# Patient Record
Sex: Male | Born: 1955 | Race: White | Hispanic: No | Marital: Single | Smoking: Current every day smoker
Health system: Southern US, Community
[De-identification: ages and names within clinical notes are randomized; demographics above are authoritative.]

## PROBLEM LIST (undated history)

## (undated) DIAGNOSIS — R569 Unspecified convulsions: Secondary | ICD-10-CM

## (undated) DIAGNOSIS — I10 Essential (primary) hypertension: Secondary | ICD-10-CM

## (undated) DIAGNOSIS — G629 Polyneuropathy, unspecified: Secondary | ICD-10-CM

## (undated) DIAGNOSIS — J449 Chronic obstructive pulmonary disease, unspecified: Secondary | ICD-10-CM

## (undated) DIAGNOSIS — J309 Allergic rhinitis, unspecified: Secondary | ICD-10-CM

## (undated) HISTORY — DX: Chronic obstructive pulmonary disease, unspecified: J44.9

## (undated) HISTORY — DX: Allergic rhinitis, unspecified: J30.9

---

## 1987-09-01 DIAGNOSIS — R569 Unspecified convulsions: Secondary | ICD-10-CM

## 1987-09-01 HISTORY — DX: Unspecified convulsions: R56.9

## 2011-12-09 ENCOUNTER — Emergency Department (HOSPITAL_COMMUNITY): Payer: Non-veteran care

## 2011-12-09 ENCOUNTER — Emergency Department (HOSPITAL_COMMUNITY)
Admission: EM | Admit: 2011-12-09 | Discharge: 2011-12-09 | Disposition: A | Discharge status: Home / Self Care | Payer: Non-veteran care | Attending: Emergency Medicine | Admitting: Emergency Medicine

## 2011-12-09 ENCOUNTER — Encounter (HOSPITAL_COMMUNITY): Payer: Self-pay | Admitting: Emergency Medicine

## 2011-12-09 DIAGNOSIS — S0100XA Unspecified open wound of scalp, initial encounter: Secondary | ICD-10-CM | POA: Insufficient documentation

## 2011-12-09 DIAGNOSIS — F17200 Nicotine dependence, unspecified, uncomplicated: Secondary | ICD-10-CM | POA: Insufficient documentation

## 2011-12-09 DIAGNOSIS — W010XXA Fall on same level from slipping, tripping and stumbling without subsequent striking against object, initial encounter: Secondary | ICD-10-CM | POA: Insufficient documentation

## 2011-12-09 DIAGNOSIS — S0181XA Laceration without foreign body of other part of head, initial encounter: Secondary | ICD-10-CM

## 2011-12-09 DIAGNOSIS — S0180XA Unspecified open wound of other part of head, initial encounter: Secondary | ICD-10-CM | POA: Insufficient documentation

## 2011-12-09 DIAGNOSIS — M542 Cervicalgia: Secondary | ICD-10-CM | POA: Insufficient documentation

## 2011-12-09 DIAGNOSIS — S0101XA Laceration without foreign body of scalp, initial encounter: Secondary | ICD-10-CM

## 2011-12-09 DIAGNOSIS — W1800XA Striking against unspecified object with subsequent fall, initial encounter: Secondary | ICD-10-CM

## 2011-12-09 DIAGNOSIS — R51 Headache: Secondary | ICD-10-CM | POA: Insufficient documentation

## 2011-12-09 DIAGNOSIS — I10 Essential (primary) hypertension: Secondary | ICD-10-CM | POA: Insufficient documentation

## 2011-12-09 DIAGNOSIS — Y92009 Unspecified place in unspecified non-institutional (private) residence as the place of occurrence of the external cause: Secondary | ICD-10-CM | POA: Insufficient documentation

## 2011-12-09 DIAGNOSIS — T148XXA Other injury of unspecified body region, initial encounter: Secondary | ICD-10-CM | POA: Insufficient documentation

## 2011-12-09 HISTORY — DX: Polyneuropathy, unspecified: G62.9

## 2011-12-09 HISTORY — DX: Essential (primary) hypertension: I10

## 2011-12-09 HISTORY — DX: Unspecified convulsions: R56.9

## 2011-12-09 MED ORDER — HYDROCODONE-ACETAMINOPHEN 5-325 MG PO TABS
2.0000 | ORAL_TABLET | Freq: Once | ORAL | Status: AC
  Administered 2011-12-09: 2 via ORAL
  Filled 2011-12-09: qty 2

## 2011-12-09 MED ORDER — IBUPROFEN 800 MG PO TABS
800.0000 mg | ORAL_TABLET | Freq: Once | ORAL | Status: AC
  Administered 2011-12-09: 800 mg via ORAL
  Filled 2011-12-09: qty 1

## 2011-12-09 NOTE — ED Notes (Signed)
Up to get a sandwich, in stocking feet, slipped on wood floors, put arm out to catch himself as he fell.  Struck top of head on a metal electrical outlet box, laceration to top of head.  Other minor abrasions.  Noted to have large hematoma in left axilla.  Severe pain with movement of left arm.

## 2011-12-09 NOTE — Discharge Instructions (Signed)
Your CT scans and Apply ice to the underarm bruise for comfort and to help with the swelling. The steri strips applied to your head will peel off by themselves.    Contusion A contusion is a deep bruise. Contusions happen when an injury causes bleeding under the skin. Signs of bruising include pain, puffiness (swelling), and discolored skin. The contusion may turn blue, purple, or yellow. HOME CARE   Put ice on the injured area.   Put ice in a plastic bag.   Place a towel between your skin and the bag.   Leave the ice on for 15 to 20 minutes, 3 to 4 times a day.   Only take medicine as told by your doctor.   Rest the injured area.   If possible, raise (elevate) the injured area to lessen puffiness.  GET HELP RIGHT AWAY IF:   You have more bruising or puffiness.   You have pain that is getting worse.   Your puffiness or pain is not helped by medicine.  MAKE SURE YOU:   Understand these instructions.   Will watch your condition.   Will get help right away if you are not doing well or get worse.  Document Released: 02/03/2008 Document Revised: 08/06/2011 Document Reviewed: 06/22/2011 [facility] Patient Information 2012 [place], [place].Contusion A contusion is a deep bruise. Contusions happen when an injury causes bleeding under the skin. Signs of bruising include pain, puffiness (swelling), and discolored skin. The contusion may turn blue, purple, or yellow. HOME CARE   Put ice on the injured area.   Put ice in a plastic bag.   Place a towel between your skin and the bag.   Leave the ice on for 15 to 20 minutes, 3 to 4 times a day.   Only take medicine as told by your doctor.   Rest the injured area.   If possible, raise (elevate) the injured area to lessen puffiness.  GET HELP RIGHT AWAY IF:   You have more bruising or puffiness.   You have pain that is getting worse.   Your puffiness or pain is not helped by medicine.  MAKE SURE YOU:   Understand these  instructions.   Will watch your condition.   Will get help right away if you are not doing well or get worse.  Document Released: 02/03/2008 Document Revised: 08/06/2011 Document Reviewed: 06/22/2011 [facility] Patient Information 2012 [place], [place].

## 2011-12-09 NOTE — ED Provider Notes (Signed)
History     CSN: [id number]  Arrival date & time 12/09/11  0430   First MD Initiated Contact with Patient 12/09/11 [phone]      Chief Complaint  Patient presents with  . Arm Pain    (Consider location/radiation/quality/duration/timing/severity/associated sxs/prior treatment) HPI Juan Baldwin is a 56 y.o. male who presents to the Emergency Department complaining of fall last night at home resulting in a superficial laceration to the top of his scalp, a small laceration over his left eyebrow, and bruising to the left axilla. He has taken no medicines and has obtained no relief from discomfort. He does not know if there was loss of consciousness.  [facility]  Past Medical History  Diagnosis Date  . Hypertension   . Neuropathy   . 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 No pertinent past surgical history.  No family history on file.  History  Substance Use Topics  . Smoking status: Current Everyday Smoker -- 1.0 packs/day for 25 years  . Smokeless tobacco: Not on file  . Alcohol Use: 1.2 oz/week    2 Glasses of wine per week     per night      Review of Systems ROS: Statement: All systems negative except as marked or noted in the HPI; Constitutional: Negative for fever and chills. ; ; Eyes: Negative for eye pain, redness and discharge. ; ; ENMT: Negative for ear pain, hoarseness, nasal congestion, sinus pressure and sore throat. ; ; Cardiovascular: Negative for chest pain, palpitations, diaphoresis, dyspnea and peripheral edema. ; ; Respiratory: Negative for cough.,. ; ; Gastrointestinal: Negative for nausea, vomiting, diarrhea, abdominal pain, blood in stool, hematemesis, jaundice and rectal bleeding. . ; ; Genitourinary: Negative for dysuria, flank pain and hematuria. ; ; Musculoskeletal: Negative for back pain and neck pain. Negative for swelling and trauma.; ; Skin: Negative for pruritus, rash, abrasions, blisters,  and skin lesion.; ; Neuro: Negative for headache,  lightheadedness and neck stiffness. Negative for weakness, altered level of consciousness , altered mental status, extremity weakness, paresthesias, involuntary movement, seizure and syncope.     Allergies  Penicillins  Home Medications   Current Outpatient Rx  Name Route Sig Dispense Refill  . ALPRAZOLAM 0.5 MG PO TABS Oral Take 0.5 mg by mouth 2 (two) times daily.    Marland Kitchen CARBAMAZEPINE 200 MG PO TABS Oral Take 200 mg by mouth 4 (four) times daily.    Marland Kitchen CETIRIZINE HCL 10 MG PO TABS Oral Take 10 mg by mouth daily.    . GUAIFENESIN 100 MG/5ML PO LIQD Oral Take 200 mg by mouth 2 times daily at 12 noon and 4 pm.    . HYDROCHLOROTHIAZIDE 25 MG PO TABS Oral Take 25 mg by mouth daily.    Marland Kitchen HYDROCODONE-ACETAMINOPHEN 5-500 MG PO TABS Oral Take 1 tablet by mouth every 4 (four) hours as needed.    Marland Kitchen RANITIDINE HCL 300 MG PO TABS Oral Take 300 mg by mouth 2 (two) times daily.      BP 149/85  Pulse 115  Temp(Src) 99.5 F (37.5 C) (Oral)  Resp 20  Ht 6' (1.829 m)  Wt 272 lb (123.378 kg)  BMI 36.89 kg/m2  SpO2 93%  Physical Exam Physical examination:  Nursing notes reviewed; Vital signs and O2 SAT reviewed;  Constitutional: Well developed, Well nourished, Well hydrated, In no acute distress; Head:  Normocephalic, 6 cm laceration to top of head surrounded by bruising. Small amount of blood present.Laceration to  left eyebrow, 1 cm, bleeding controlled.   Eyes: EOMI, PERRL, No scleral icterus; ENMT: Mouth and pharynx normal, Mucous membranes moist; Neck: Supple, Full range of motion, No lymphadenopathy; Cardiovascular: Regular rate and rhythm, No murmur, rub, or gallop; Respiratory: Breath sounds equal bilaterally, No rales, rhonchi, or rub,  Expiratory wheezing present. Normal respiratory effort/excursion; Chest: Nontender, Movement normal; Abdomen: Soft, Nontender, Nondistended, Normal bowel sounds; Genitourinary: No CVA tenderness; Extremities: Pulses normal, No tenderness, No edema, No calf edema or  asymmetry.; Neuro: AA&Ox3, Major CN grossly intact.  No gross focal motor or sensory deficits in extremities.; Skin: Color normal, Warm, Dry, Large bruise to the left axilla, tender to palpation.   ED Course  Procedures (including critical care time)  Ct Head Wo Contrast  12/09/2011  *RADIOLOGY REPORT*  Clinical Data: History of fall with trauma to the head. Head pain.  CT HEAD WITHOUT CONTRAST  Technique:  Contiguous axial images were obtained from the base of the skull through the vertex without contrast.  Comparison: No priors.  Findings: No acute displaced skull fractures are identified.  No signs of acute intracranial hemorrhage, or other acute intracranial abnormality.  Specifically, no focal mass, mass effect, hydrocephalus or abnormal intra or extra-axial fluid collections. Visualized paranasal sinuses and mastoids are generally well pneumatized, with exception of a left mastoid effusion and some mucosal thickening, and dependent air-fluid level within the right maxillary sinus, suggesting sinusitis.  IMPRESSION: 1.  No acute displaced skull fractures or acute intracranial abnormalities. 2.  Small left mastoid effusion. 3.  Findings suggestive of acute sinusitis of the left maxillary sinus, as above.  Original Report Authenticated By: Florencia Reasons, M.D.   Ct Cervical Spine Wo Contrast  12/09/2011  *RADIOLOGY REPORT*  Clinical Data: History of fall complaining of neck pain.  CT CERVICAL SPINE WITHOUT CONTRAST  Technique:  Multidetector CT imaging of the cervical spine was performed. Multiplanar CT image reconstructions were also generated.  Comparison: No priors.  Findings: No acute displaced fractures of the cervical spine. Alignment is anatomic.  Prevertebral soft tissues are normal. There is multilevel degenerative disc disease, most severe at C3- C4, C5-C6 and C6-C7.  There is also multilevel facet arthropathy. Visualized portions of the lung apices are unremarkable. Incidental note is made  of mucosal thickening and an air-fluid level in the left maxillary sinus, and left mastoid effusion.  IMPRESSION: 1.  No evidence of significant acute traumatic injury to the cervical spine. 2.  Multilevel degenerative disc disease and cervical spondylosis, as detailed above. 3.  Left mastoid effusion. 4.  Findings suggestive of acute sinusitis in the left maxillary sinus.  Original Report Authenticated By: Florencia Reasons, M.D.   Dg Shoulder Left  12/09/2011  *RADIOLOGY REPORT*  Clinical Data: Status post fall on outstretched left arm; large left axillary hematoma.  LEFT SHOULDER - 2+ VIEW  Comparison: None.  Findings: There is no evidence of fracture or dislocation.  The left humeral head is seated within the glenoid fossa.  The acromioclavicular joint is unremarkable in appearance.  The known soft tissue hematoma is not well characterized on radiograph.  The visualized portions of the left lung are clear.  IMPRESSION: No evidence of fracture or dislocation.  Original Report Authenticated By: Tonia Ghent, M.D.    MDM  Patient status post a fall in his home last night resulting in lacerations to his scalp, left eyebrow. He also has sustained a large bruise to the left axilla. CT of the head and cervical spine are  negative for any acute process. Left shoulder x-ray is negative. Steri-Strips were applied to the superficial laceration on the scalp.Pt stable in ED with no significant deterioration in condition.The patient appears reasonably screened and/or stabilized for discharge and I doubt any other medical condition or other [facility] requiring further screening, evaluation, or treatment in the ED at this time prior to discharge.  MDM Reviewed: nursing note and vitals Interpretation: x-ray and CT scan           Nicoletta Dress. Colon Branch, MD 12/09/11 1610

## 2011-12-23 ENCOUNTER — Telehealth: Payer: Self-pay | Admitting: *Deleted

## 2011-12-23 NOTE — Telephone Encounter (Signed)
error 

## 2013-07-21 IMAGING — CT CT HEAD W/O CM
4 of 5 series · 15 of 47 positions shown, 16 images · non-contrast
Comparison: No priors.

CLINICAL DATA: History of fall with trauma to the head. Head pain.

CT HEAD WITHOUT CONTRAST
TECHNIQUE: Contiguous axial images were obtained from the base of
the skull through the vertex without contrast.

[Series 2: headseq 4.8 h37s · axial · 0.51mm/px · z∈[+294,+354]mm · 2 of 36 slices shown, 3 images]
[im 12/36  brain]
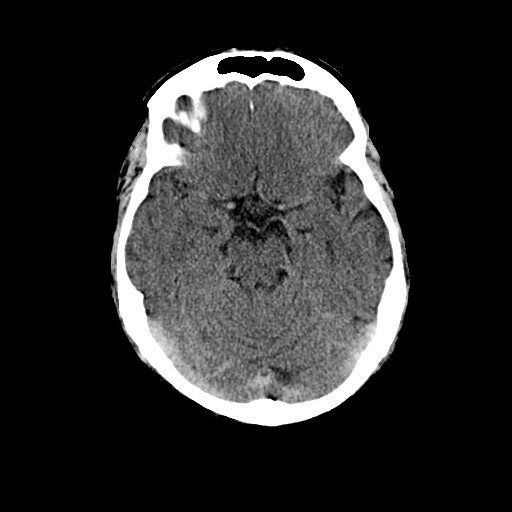
[im 12/36  bone]
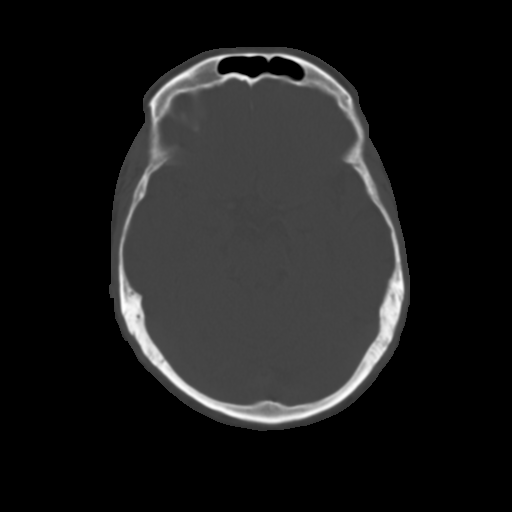
[im 24/36  brain]
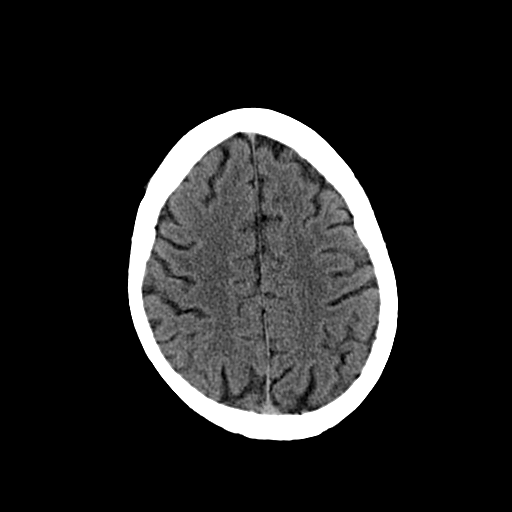

[Series 7: sagittal bone 2.0 · sagittal · 0.29mm/px · 3 of 73 slices shown]
[im 25/73  brain]
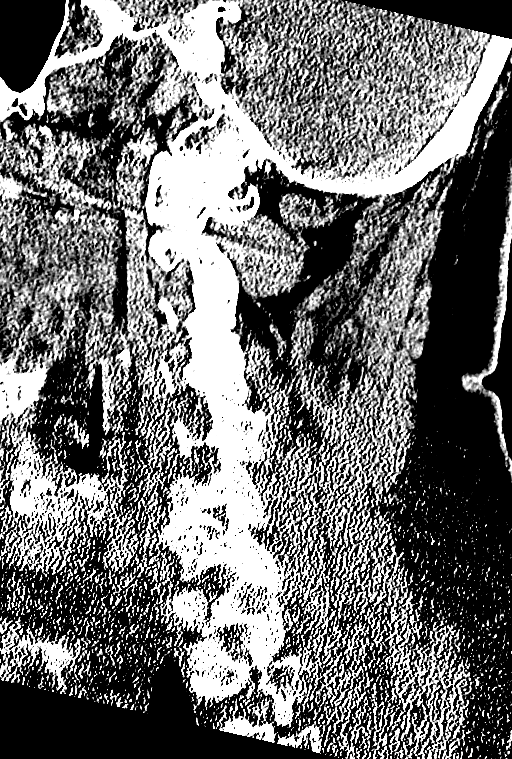
[im 37/73  brain]
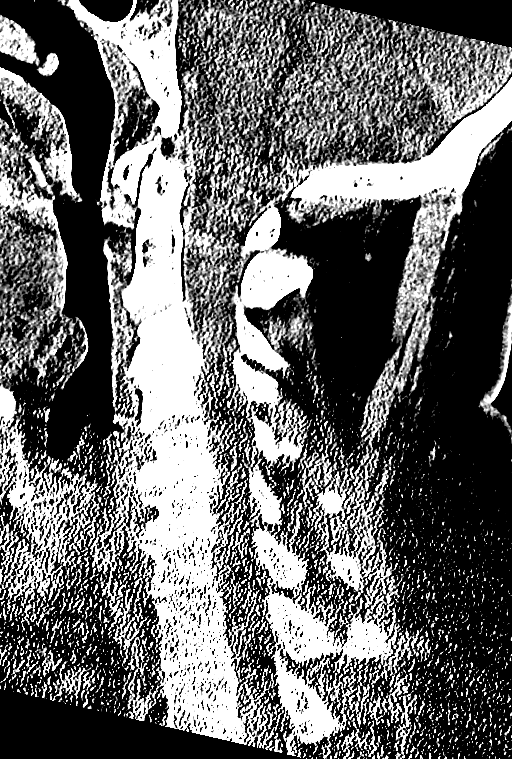
[im 49/73  brain]
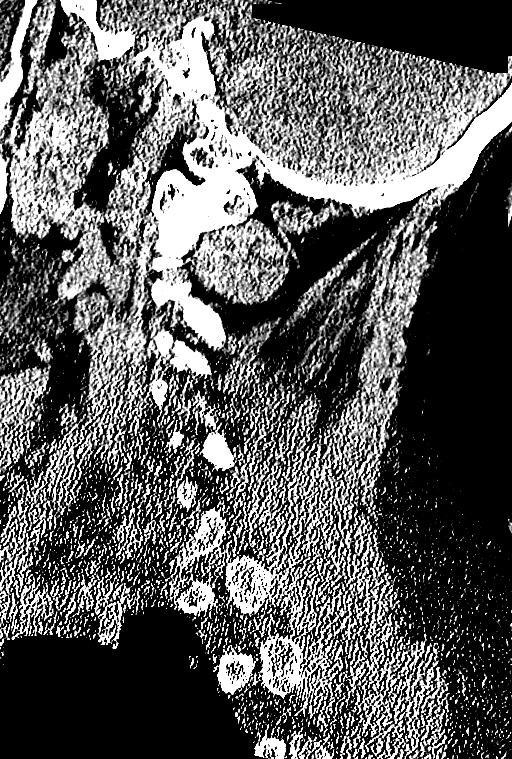

[Series 8: coronal bone 2.0 · coronal · 0.29mm/px · 3 of 55 slices shown]
[im 19/55  brain]
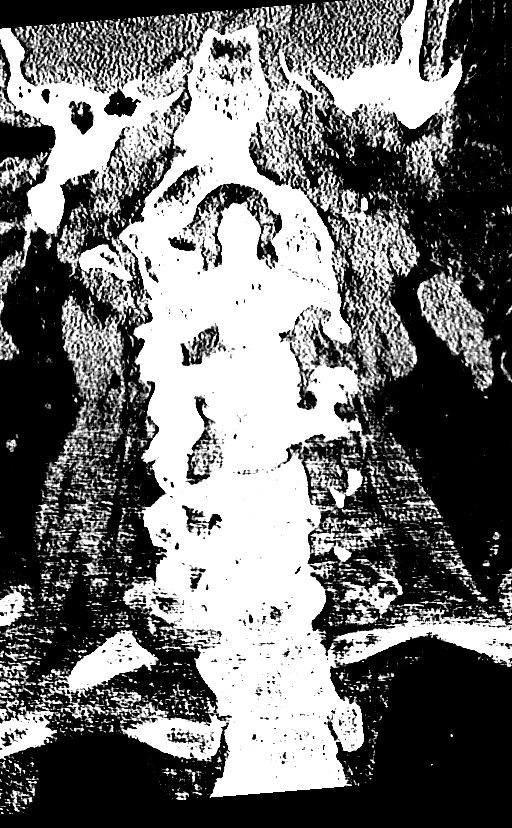
[im 25/55  brain]
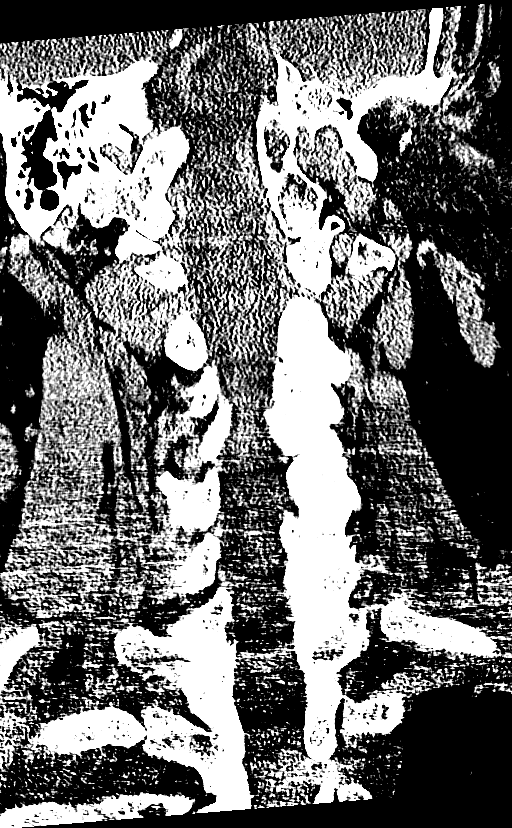
[im 31/55  brain]
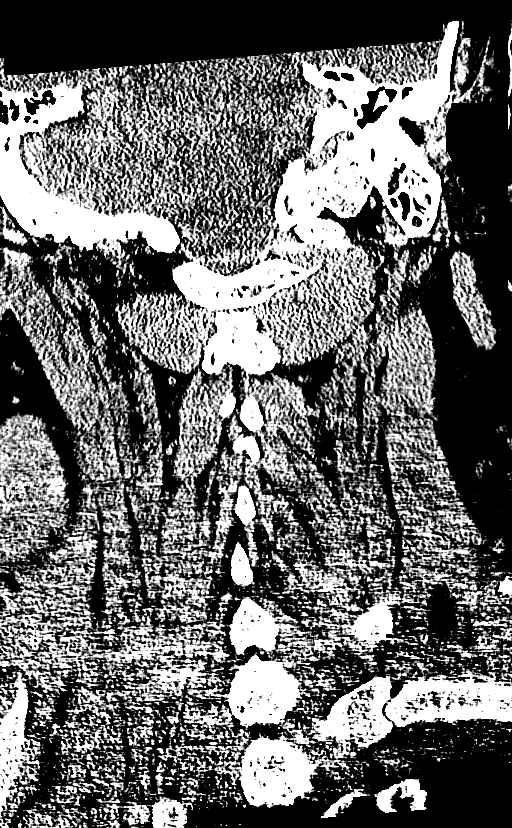

[Series 9: axial bone 2.0 · axial · 0.22mm/px · z∈[+36,+167]mm · 7 of 105 slices shown]
[im 9/105  bone]
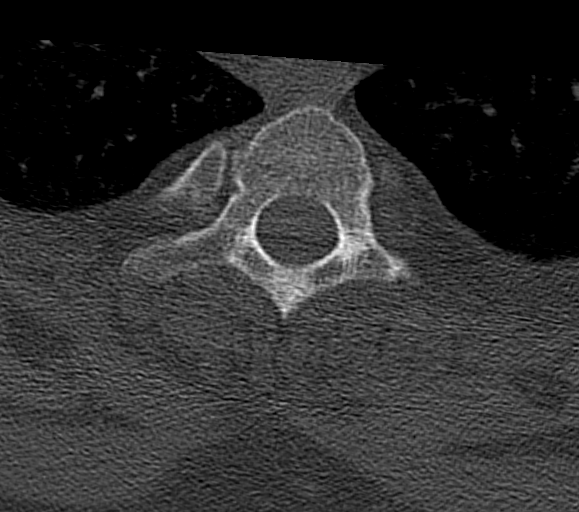
[im 27/105  bone]
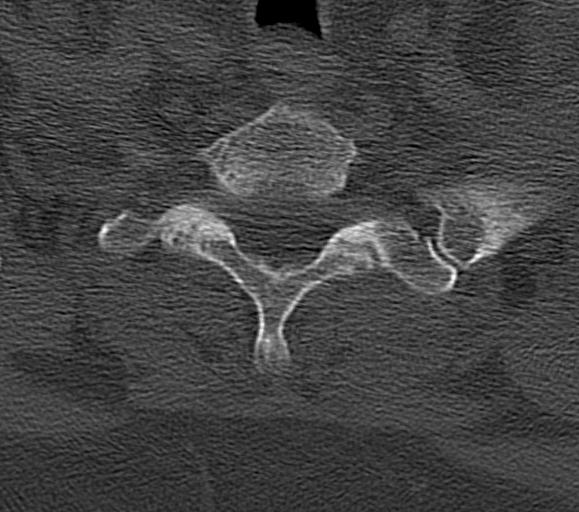
[im 35/105  bone]
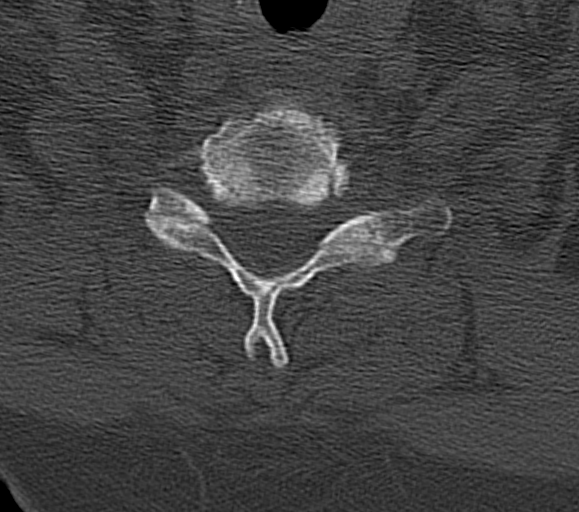
[im 44/105  bone]
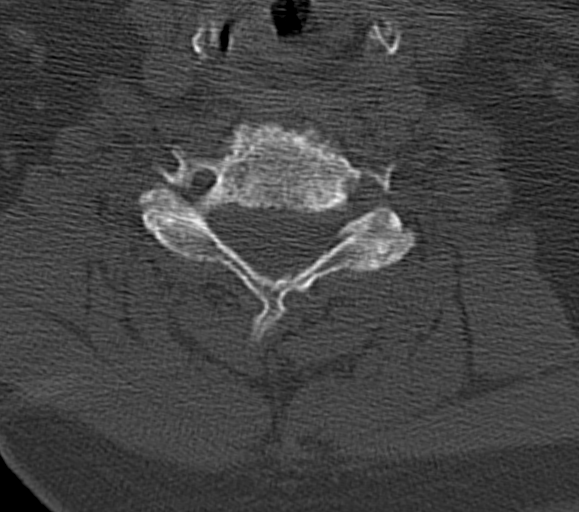
[im 61/105  bone]
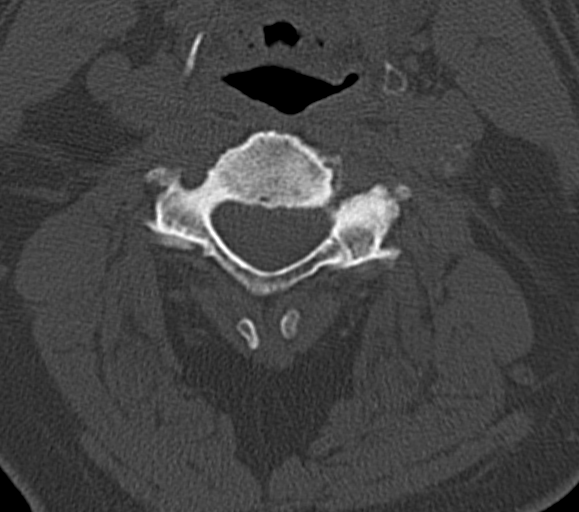
[im 70/105  bone]
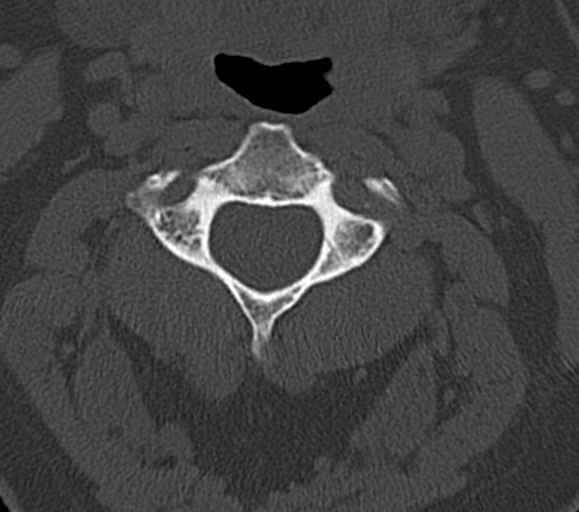
[im 79/105  bone]
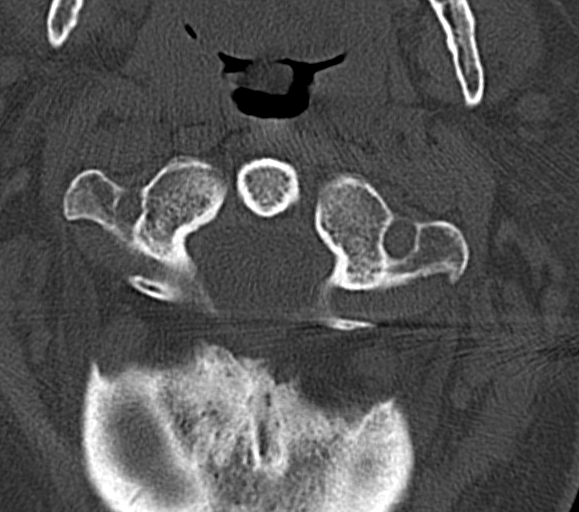

[15 of 47 positions shown; findings below may reference images not displayed]

FINDINGS: No acute displaced skull fractures are identified.  No
signs of acute intracranial hemorrhage, or other acute intracranial
abnormality.  Specifically, no focal mass, mass effect,
hydrocephalus or abnormal intra or extra-axial fluid collections.
Visualized paranasal sinuses and mastoids are generally well
pneumatized, with exception of a left mastoid effusion and some
mucosal thickening, and dependent air-fluid level within the right
maxillary sinus, suggesting sinusitis.
IMPRESSION: 1.  No acute displaced skull fractures or acute intracranial
abnormalities.
2.  Small left mastoid effusion.
3.  Findings suggestive of acute sinusitis of the left maxillary
sinus, as above.

## 2013-07-21 IMAGING — CR DG SHOULDER 2+V*L*
3 series · 3 of 3 positions shown · non-contrast
Comparison: None.

CLINICAL DATA: Status post fall on outstretched left arm; large
left axillary hematoma.

LEFT SHOULDER - 2+ VIEW

[view not recorded (1 of 3)]
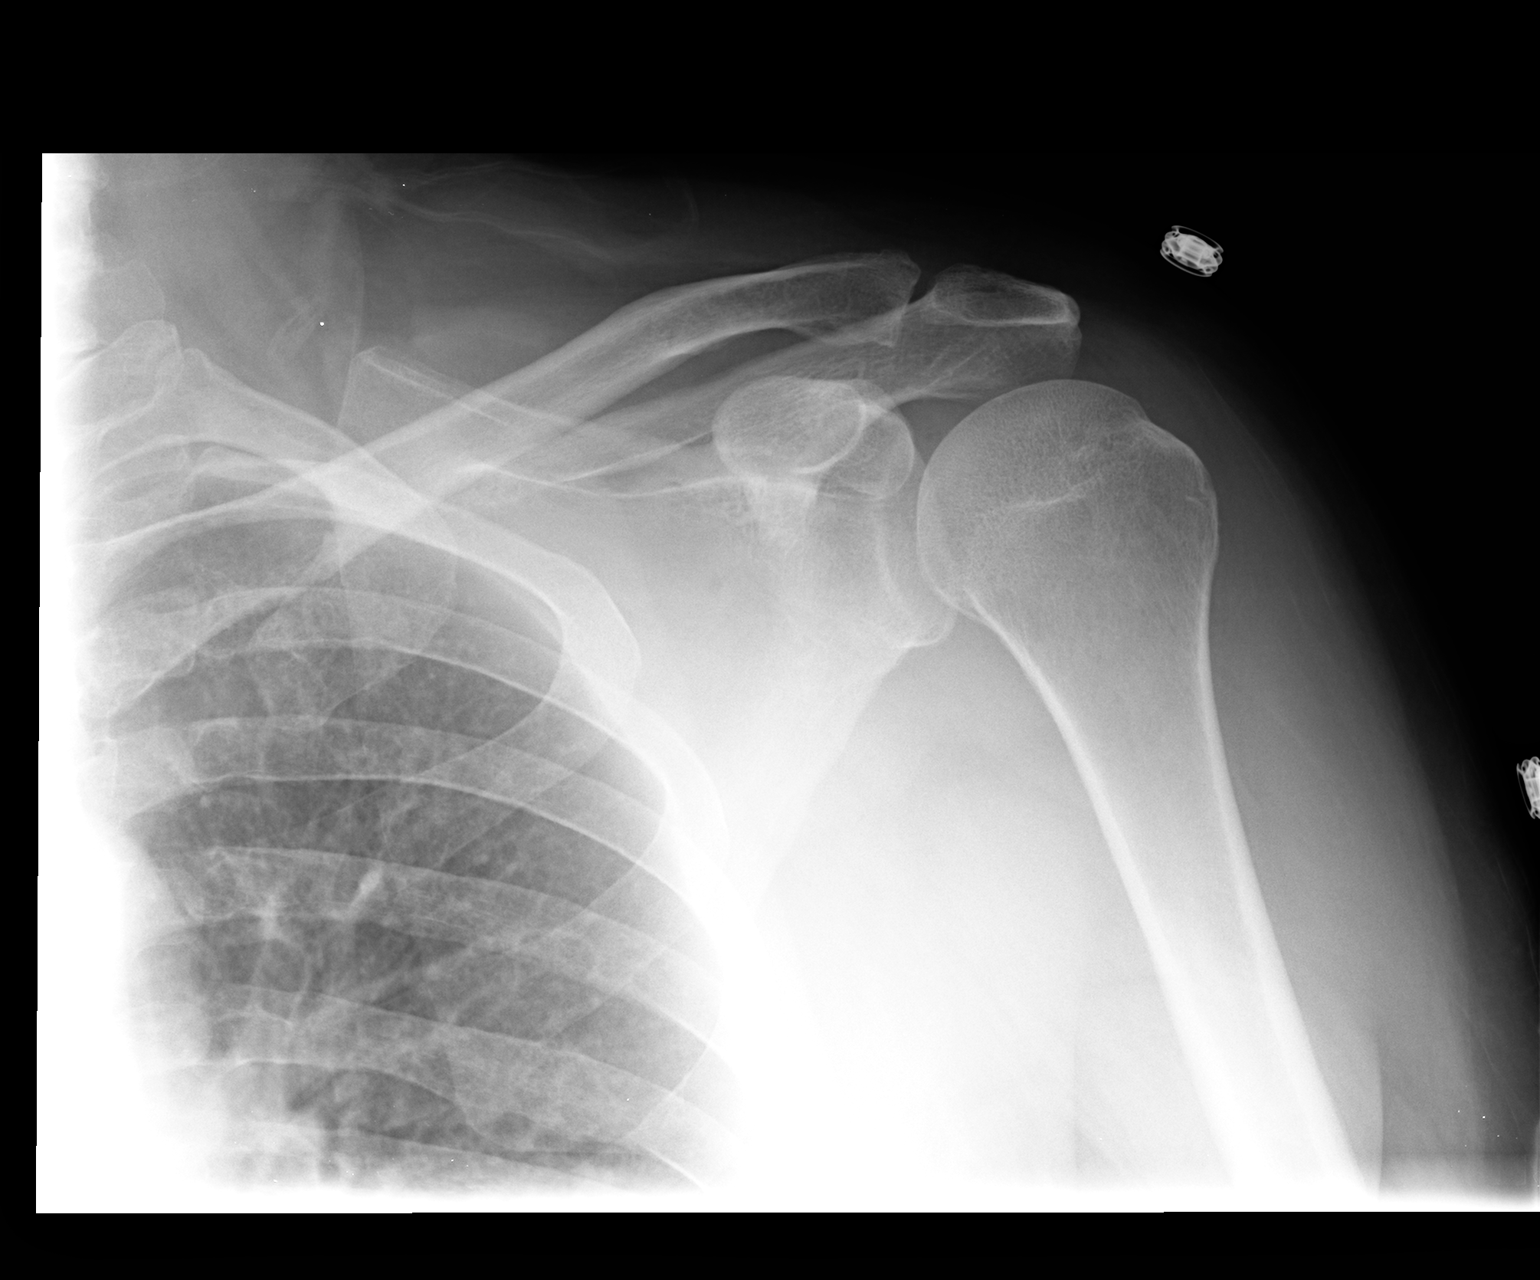

[view not recorded (2 of 3)]
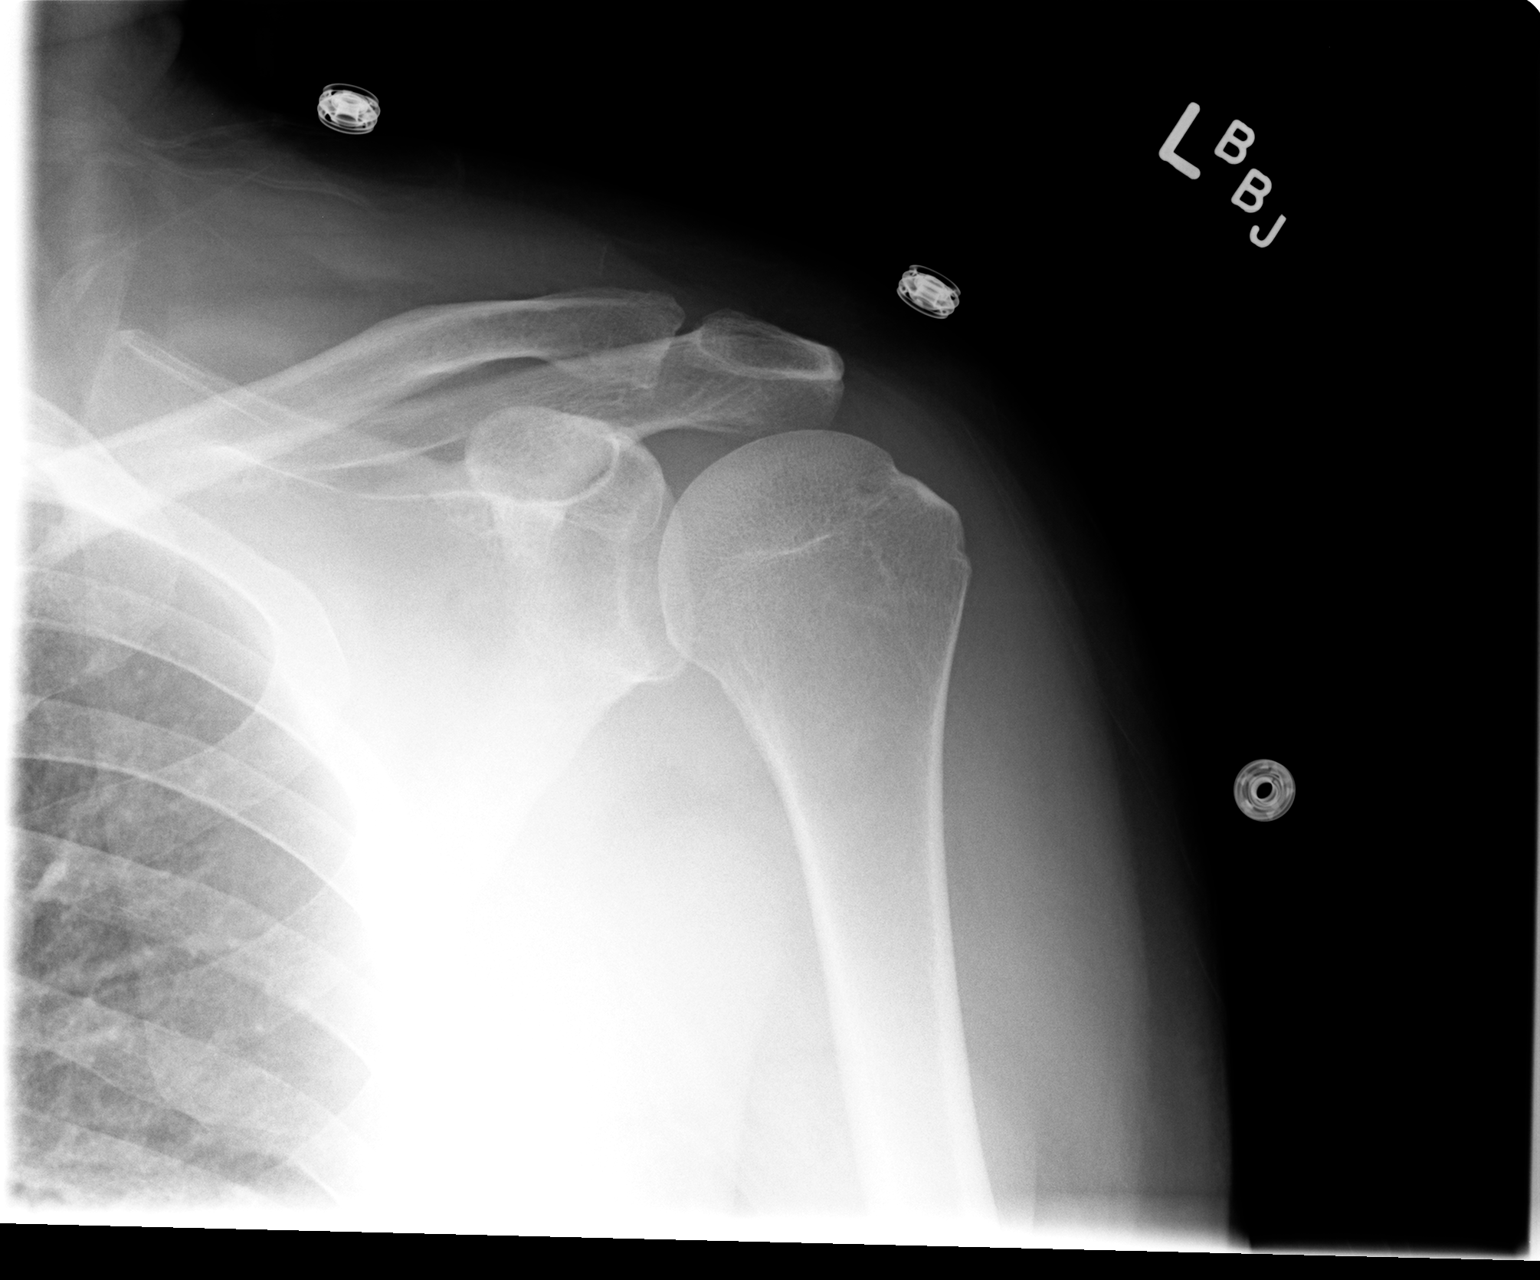

[view not recorded (3 of 3)]
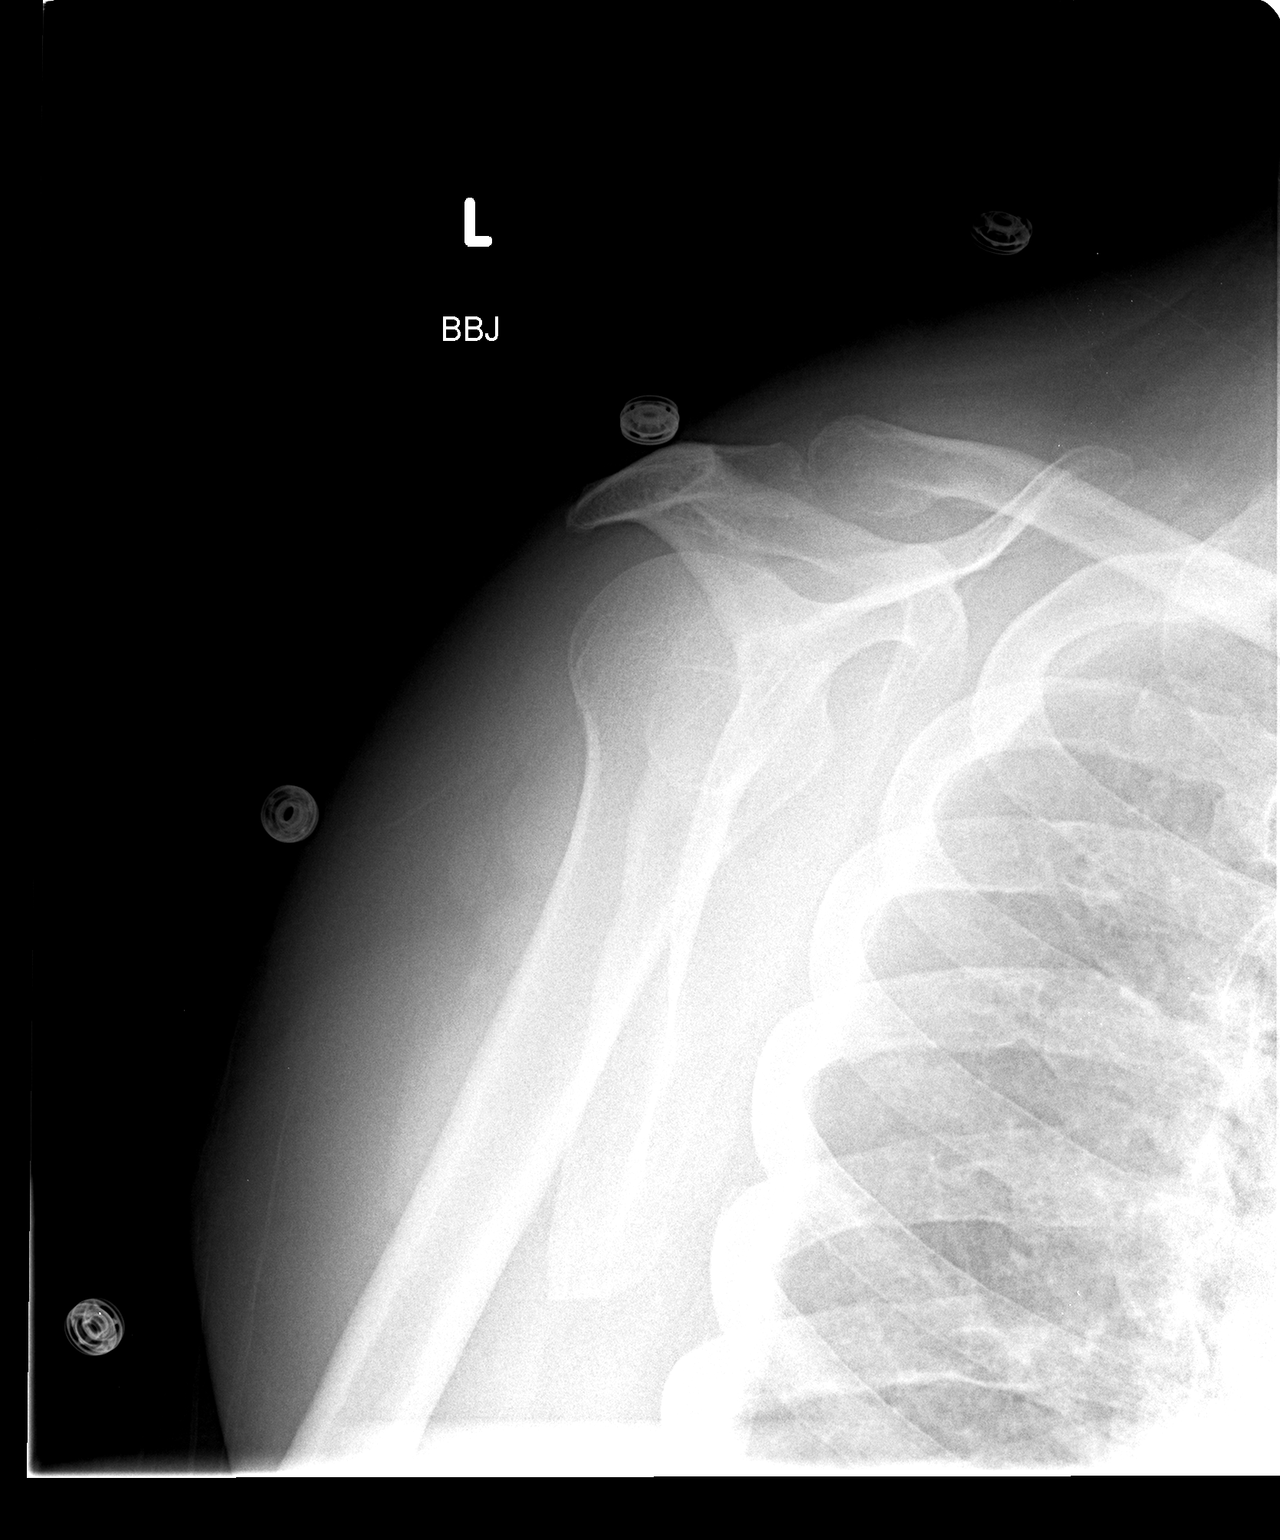

[3 of 3 positions shown; findings below may reference images not displayed]

FINDINGS: There is no evidence of fracture or dislocation.  The
left humeral head is seated within the glenoid fossa.  The
acromioclavicular joint is unremarkable in appearance.  The known
soft tissue hematoma is not well characterized on radiograph.  The
visualized portions of the left lung are clear.
IMPRESSION: No evidence of fracture or dislocation.

## 2015-11-25 ENCOUNTER — Telehealth: Payer: Self-pay | Admitting: *Deleted

## 2015-11-25 NOTE — Telephone Encounter (Signed)
Pt states he has questions about fungal toenail treatment.  I left message explaining 1st the toenails would need to sent for testing for fungus type, then if + the treatment would be discussed with the pt and the options were topical, oral, laser and the doctor would discuss them in more depth.  I encouraged pt to make an appt.

## 2018-07-12 ENCOUNTER — Encounter (INDEPENDENT_AMBULATORY_CARE_PROVIDER_SITE_OTHER): Payer: Self-pay | Admitting: *Deleted

## 2018-09-16 ENCOUNTER — Telehealth: Payer: Self-pay | Admitting: *Deleted

## 2018-09-16 NOTE — Telephone Encounter (Signed)
Patient called scheduled a new patient, patient has clinical questions please call patient

## 2018-09-16 NOTE — Telephone Encounter (Signed)
Called and spoke with patient. Patient was just calling to see how long he would need to be on allergy shots? I informed him SHOULD the doctor decide that this would be in his treatment plan, allergy shots are typically given for 3-5 years. He also states that he sees an allergist within the [place] and they are wanting to do a "Penicilin Challenge" with him and he was just wanting to confirm that this "was a thing." I assured him we do the same challenges.

## 2018-10-05 ENCOUNTER — Other Ambulatory Visit (INDEPENDENT_AMBULATORY_CARE_PROVIDER_SITE_OTHER): Payer: Self-pay | Admitting: *Deleted

## 2018-10-05 DIAGNOSIS — Z860100 Personal history of colon polyps, unspecified: Secondary | ICD-10-CM | POA: Insufficient documentation

## 2018-10-19 ENCOUNTER — Ambulatory Visit (INDEPENDENT_AMBULATORY_CARE_PROVIDER_SITE_OTHER): Payer: PRIVATE HEALTH INSURANCE | Admitting: Allergy

## 2018-10-19 ENCOUNTER — Ambulatory Visit: Payer: PRIVATE HEALTH INSURANCE | Admitting: Allergy and Immunology

## 2018-10-19 ENCOUNTER — Encounter: Payer: Self-pay | Admitting: Allergy

## 2018-10-19 VITALS — BP 112/70 | HR 101 | Temp 97.4°F | Resp 16 | Ht 71.0 in | Wt 287.0 lb

## 2018-10-19 DIAGNOSIS — T50905D Adverse effect of unspecified drugs, medicaments and biological substances, subsequent encounter: Secondary | ICD-10-CM

## 2018-10-19 DIAGNOSIS — H1013 Acute atopic conjunctivitis, bilateral: Secondary | ICD-10-CM | POA: Diagnosis not present

## 2018-10-19 DIAGNOSIS — T781XXD Other adverse food reactions, not elsewhere classified, subsequent encounter: Secondary | ICD-10-CM

## 2018-10-19 DIAGNOSIS — J3089 Other allergic rhinitis: Secondary | ICD-10-CM

## 2018-10-19 DIAGNOSIS — J410 Simple chronic bronchitis: Secondary | ICD-10-CM | POA: Diagnosis not present

## 2018-10-19 MED ORDER — ALBUTEROL SULFATE HFA 108 (90 BASE) MCG/ACT IN AERS: 2.0000 | INHALATION_SPRAY | RESPIRATORY_TRACT | 2 refills | Status: AC | PRN

## 2018-10-19 MED ORDER — OLOPATADINE HCL 0.7 % OP SOLN: 1.0000 [drp] | Freq: Every day | OPHTHALMIC | 3 refills | Status: AC

## 2018-10-19 MED ORDER — LEVOCETIRIZINE DIHYDROCHLORIDE 5 MG PO TABS: 5.0000 mg | ORAL_TABLET | Freq: Every evening | ORAL | 1 refills | Status: AC

## 2018-10-19 NOTE — Patient Instructions (Addendum)
Allergies   - continue avoidance measures for dust mites, dog, grass pollen, tree pollen, weed pollen, cockroach, molds  - trial Xyzal 5mg  daily (this replaces Zyrtec)  - continue Singulair 10mg  daily  - continue Flonase 2 sprays each nostril daily as needed for nasal congestion.  Use for 1-2 weeks at a time before stopping once symptoms improve  - continue nasal Atrovent for nasal drainage as needed  - trial Pazeo or Pataday eye drop 1 drop each eye daily as needed (this replaces Zaditor)  - allergen immunotherapy discussed today including protocol, benefits and risk.  Informational handout provided.    COPD  - will have you have access to albuterol.  have access to albuterol inhaler 2 puffs every 4-6 hours as needed for cough/wheeze/shortness of breath/chest tightness.  May use 15-20 minutes prior to activity.   Monitor frequency of use.    - continue Spiriva 2 puffs daily  - will have you start Symbicort 160mg  2 puffs twice a day over the next month  - will repeat spirometry (breathing testing) to determine if we can improve your lung function with inhaler medications as above  Pollen food allergy syndrome  - The oral allergy syndrome (OAS) or pollen-food allergy syndrome (PFAS) is a relatively common form of food allergy, particularly in adults. It typically occurs in people who have pollen allergies when the immune system "sees" proteins on the food that look like proteins on the pollen. This results in the allergy antibody (IgE) binding to the food instead of the pollen. Patients typically report itching and/or mild swelling of the mouth and throat immediately following ingestion of certain uncooked fruits (including nuts) or raw vegetables. Only a very small number of affected individuals experience systemic allergic reactions, such as anaphylaxis which occurs with true food allergies.    This is likely what's happening with tomatoes.    Drug allergy   - agree with penicillin testing and  challenge with Dr. Blanchard Mane    Follow-up in 1 month

## 2018-10-19 NOTE — Progress Notes (Signed)
New Patient Note  RE: Juan AtlasMichael S Nordlund MRN: [id number] DOB: 1956-04-05 Date of Office Visit: 10/19/2018  Referring provider: Floyde Parkinsyer, Christopher, MD Primary care provider: Floyde Parkinsyer, Christopher, MD  Chief Complaint: Allergies  History of present illness: Juan Baldwin is a 63 y.o. male presenting today for consultation for allergic rhinitis.  He also has history of COPD.   He is seeing Dr. Blanchard ManeKrishnaswamy, allergist, at [facility]     With his allergies he states that he is at a point where his medications are not working.  He is taking Zyrtec daily, Flonase in the evening and nasal Atrovent in the morning, singular daily and Zaditor as needed.  He states since he has been off the Zyrtec that he actually feels better and states that his allergy symptoms have not worsened off Zyrtec for this visit today.  He states that he still has a lot of watery eyes and goopy eye drainage.  He states [facility] however pt states they do not offer allergy shots there.  Thus his referral to me.    He states his allergy symptoms include cough, occasional sneezing fits, nasal congestion/drainage, watery eyes.    Symptoms are worse in the spring and fall but has been year-round lately.    With tomato ingestion he develops mouth itch.  He still eats tomato products.    He states he was told he has COPD about 2 years ago.  He has been on spiriva 2 puffs daily since that time.  He does not have an albuterol inhaler.  He states he gets winded with activities like walking to the mailbox.    He takes pepcid for reflux control but states it does not work as well zantac did.    He has a listed PCN allergy.  Dec 1982 he recalls developing hives after a PCN injection during [employer].  He has been avoiding PCN since that time.  He is set to have PCN testing/challenge on 3/4 with Dr. Blanchard ManeKrishnaswamy.    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    ------------  He had allergy testing via serum IgE in 01/2017 showing in kU/L: d. pter 10.3, d. Far 13.1, dog dander 0.11, [place] grass 0.12, Timothy grass 0.25, Johnson grass 0.17, cockroach 0.23, penicillium 3.7, Cladosporium 0.58, Aspergillus 1.11, Alternaria 0.21, Maple 0.12, mountain cedar 0.44, oak 0.2, elm 0.21, Cottonwood 0.12, hickory/pecan 0.15, common ragweed 0.27, sheep Sorell 0.12  He has had IgG testing showing IgG thousand 23, IgM 49, IgA 312.  He had a chest x-ray that showed a tiny focus of atelectasis at the right lateral costophrenic angle.  Mild peribronchial cuffing appears slightly worse from the prior studies.  No focal consolidation, effusion, edema or pneumothorax.  Mediastinum and cardiac silhouette normal.   Review of systems: Review of Systems  Constitutional: Negative for chills, fever and malaise/fatigue.  HENT: Positive for congestion. Negative for ear discharge, nosebleeds and sore throat.   Eyes: Positive for discharge. Negative for pain and redness.  Respiratory: Positive for shortness of breath. Negative for cough and wheezing.   Cardiovascular: Negative for chest pain.  Gastrointestinal: Negative for abdominal pain, constipation, diarrhea, heartburn, nausea and vomiting.  Musculoskeletal: Positive for joint pain.  Skin: Negative for itching and rash.  Neurological: Negative for headaches.    All other systems negative unless noted above in HPI  Past medical history: Past Medical History:  Diagnosis  Date  . Allergic rhinitis   . COPD (chronic obstructive pulmonary disease) (HCC)   . Hypertension   . Neuropathy   . 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  Family history:  Family History  Problem Relation Age of Onset  . Asthma Neg Hx   . Allergies Neg Hx     Social history: He lives in a home with carpeting in the bedroom with gas heating and central cooling.  No  pets in the home.  No concern for water damage, mildew or roaches in the home.  He is disabled and does not work.  He has a smoking history of menthols 1 pack/day since 1976.  He is in the process of quitting.  Medication List: Allergies as of 10/19/2018      Reactions   Amlodipine    Chlorzoxazone    Cyclobenzaprine    Gabapentin    Penicillins Hives      Medication List       Accurate as of October 19, 2018  4:58 PM. Always use your most recent med list.        albuterol 108 (90 Base) MCG/ACT inhaler Commonly known as:  PROVENTIL HFA;VENTOLIN HFA Inhale 2 puffs into the lungs every 4 (four) hours as needed for wheezing or shortness of breath.   ALPRAZolam 0.5 MG tablet Commonly known as:  XANAX Take 0.5 mg by mouth 2 (two) times daily.   baclofen 10 MG tablet Commonly known as:  LIORESAL Take 10 mg by mouth 3 (three) times daily.   carbamazepine 200 MG tablet Commonly known as:  TEGRETOL Take 200 mg by mouth 4 (four) times daily.   cetirizine 10 MG tablet Commonly known as:  ZYRTEC Take 10 mg by mouth daily.   cholecalciferol 25 MCG (1000 UT) tablet Commonly known as:  VITAMIN D Take 1,000 Units by mouth daily.   clotrimazole 1 % cream Commonly known as:  LOTRIMIN Apply 1 application topically 2 (two) times daily.   famotidine 20 MG tablet Commonly known as:  PEPCID Take 20 mg by mouth 2 (two) times daily.   fluticasone 50 MCG/ACT nasal spray Commonly known as:  FLONASE Place 2 sprays into both nostrils daily.   guaiFENesin 100 MG/5ML liquid Commonly known as:  ROBITUSSIN Take 200 mg by mouth 2 times daily at 12 noon and 4 pm.   hydrochlorothiazide 25 MG tablet Commonly known as:  HYDRODIURIL Take 25 mg by mouth daily.   ipratropium 0.03 % nasal spray Commonly known as:  ATROVENT Place 2 sprays into both nostrils every 12 (twelve) hours.   ketotifen 0.025 % ophthalmic solution Commonly known as:  ZADITOR 1 drop 2 (two) times daily.     levocetirizine 5 MG tablet Commonly known as:  XYZAL Take 1 tablet (5 mg total) by mouth every evening.   lisinopril 40 MG tablet Commonly known as:  PRINIVIL,ZESTRIL Take 40 mg by mouth daily.   montelukast 10 MG tablet Commonly known as:  SINGULAIR Take 10 mg by mouth at bedtime.   multivitamin tablet Take 1 tablet by mouth daily.   mupirocin ointment 2 % Commonly known as:  BACTROBAN Place 1 application into the nose 2 (two) times daily.   Olopatadine HCl 0.7 % Soln Commonly known as:  PAZEO Apply 1 drop to eye daily.   sildenafil 100 MG tablet Commonly known as:  VIAGRA Take 100 mg by mouth daily as needed for erectile  dysfunction.   silver sulfADIAZINE 1 % cream Commonly known as:  SILVADENE Apply 1 application topically daily.   terbinafine 250 MG tablet Commonly known as:  LAMISIL Take 250 mg by mouth daily.   Tiotropium Bromide Monohydrate 1.25 MCG/ACT Aers Inhale 2 puffs into the lungs daily.       Known medication allergies: Allergies  Allergen Reactions  . Amlodipine   . Chlorzoxazone   . Cyclobenzaprine   . Gabapentin   . Penicillins Hives    Physical examination: Blood pressure 112/70, pulse (!) 101, temperature (!) 97.4 F (36.3 C), temperature source Oral, resp. rate 16, height 5\' 11"  (1.803 m), weight 287 lb (130.2 kg), SpO2 96 %.  General: Alert, interactive, in no acute distress, overweight. HEENT: PERRLA, TMs pearly gray, turbinates minimally edematous without discharge, post-pharynx non erythematous. Neck: Supple without lymphadenopathy. Lungs: Mildly decreased breath sounds bilaterally without wheezing, rhonchi or rales. {no increased work of breathing. CV: Normal S1, S2 without murmurs. Abdomen: Nondistended, nontender. Skin: Warm and dry, without lesions or rashes. Extremities:  No clubbing, cyanosis or edema. Neuro:   Grossly intact.  Diagnositics/Labs: Labs: see HPI  Spirometry: FEV1: 1.81L 49%, FVC: 2.83L 58%, ratio  consistent with obstructive with restrictive pattern  Assessment and plan:   Allergic rhinitis with conjunctivitis  - continue avoidance measures for dust mites, dog, grass pollen, tree pollen, weed pollen, cockroach, molds  - trial Xyzal 5mg  daily (this replaces Zyrtec)  - continue Singulair 10mg  daily  - continue Flonase 2 sprays each nostril daily as needed for nasal congestion.  Use for 1-2 weeks at a time before stopping once symptoms improve  - continue nasal Atrovent for nasal drainage as needed  - trial Pazeo or Pataday eye drop 1 drop each eye daily as needed (this replaces Zaditor)  - allergen immunotherapy discussed today including protocol, benefits and risk.  Informational handout provided.  Would like to see if can improve his lung function prior to starting immunotherapy.   COPD  - will have you have access to albuterol.  have access to albuterol inhaler 2 puffs every 4-6 hours as needed for cough/wheeze/shortness of breath/chest tightness.  May use 15-20 minutes prior to activity.   Monitor frequency of use.    - continue Spiriva 2 puffs daily  - will have you start Symbicort 160mg  2 puffs twice a day over the next month  - will repeat spirometry (breathing testing) to determine if we can improve your lung function with inhaler medications as above  Pollen food allergy syndrome  - The oral allergy syndrome (OAS) or pollen-food allergy syndrome (PFAS) is a relatively common form of food allergy, particularly in adults. It typically occurs in people who have pollen allergies when the immune system "sees" proteins on the food that look like proteins on the pollen. This results in the allergy antibody (IgE) binding to the food instead of the pollen. Patients typically report itching and/or mild swelling of the mouth and throat immediately following ingestion of certain uncooked fruits (including nuts) or raw vegetables. Only a very small number of affected individuals experience  systemic allergic reactions, such as anaphylaxis which occurs with true food allergies.    This is likely what's happening with tomatoes.    Drug allergy   - agree with penicillin testing and challenge with Dr. Blanchard Mane  Follow-up in 1 month   I appreciate the opportunity to take part in Tyriek's care. Please do not hesitate to contact me with questions.  Sincerely,  Prudy Feeler, MD Allergy/Immunology [facility]

## 2018-10-25 ENCOUNTER — Telehealth: Payer: Self-pay | Admitting: *Deleted

## 2018-10-25 ENCOUNTER — Encounter: Payer: Self-pay | Admitting: *Deleted

## 2018-10-25 NOTE — Telephone Encounter (Signed)
Called patient to inform him per Dr. Delorse Lek, patient needs to hold off on starting injections until he has follow up office visit with Dr. Delorse Lek.  Start injection appointment for 11/07/18 was canceled and patient will keep appointment with Dr. Delorse Lek on 11/24/18.  Patient voiced understanding and patient will contact the office if he needs anything prior to the visit.

## 2018-10-27 NOTE — Telephone Encounter (Signed)
Opened in error. See previous telephone note.

## 2018-11-07 ENCOUNTER — Ambulatory Visit: Payer: PRIVATE HEALTH INSURANCE

## 2018-11-14 ENCOUNTER — Telehealth: Payer: Self-pay | Admitting: Allergy

## 2018-11-14 NOTE — Telephone Encounter (Signed)
Patient called and said he needs the name of the inhaler that does the same as the combination of the inhalers that he had. He needs to give that name to the [place] so he can get them to order it.

## 2018-11-14 NOTE — Telephone Encounter (Signed)
Called patient he will call back due to phone breaking up

## 2018-11-14 NOTE — Telephone Encounter (Signed)
Spoke to patient wants the name of an inhaler that is a combination of the Symbicort and Spiriva. Dr Delorse Lek please advise or call patient he is aware we wont reach out to him until Tuesday 11/15/2018

## 2018-11-15 ENCOUNTER — Other Ambulatory Visit: Payer: Self-pay | Admitting: *Deleted

## 2018-11-15 MED ORDER — FLUTICASONE-UMECLIDIN-VILANT 100-62.5-25 MCG/INH IN AEPB: 1.0000 | INHALATION_SPRAY | Freq: Every day | RESPIRATORY_TRACT | 5 refills | Status: DC

## 2018-11-15 NOTE — Telephone Encounter (Signed)
He is likely referring to Trelegy as I discussed that option with him at visit however we did not have any samples thus since he was already on spiriva provided him with symbicort sample in office to make up a 3-drug regimen for COPD.

## 2018-11-15 NOTE — Telephone Encounter (Signed)
Called patient, call went straight to voicemail and unfortunately mailbox is full. Will need to attempt to call again.

## 2018-11-15 NOTE — Telephone Encounter (Signed)
Spoke with patient and he was referring to Trelegy. I informed him I would send samples to [facility] with Apolonio Schneiders and he can come pick them up or wait until his apt on the 26th. He stated he would wait until his apt to pick up samples, and we sent prescription for Trelegy to the [facility] pharmacy in his chart. Patient had no other questions.

## 2018-11-22 ENCOUNTER — Telehealth: Payer: Self-pay | Admitting: Allergy

## 2018-11-22 ENCOUNTER — Other Ambulatory Visit: Payer: Self-pay | Admitting: *Deleted

## 2018-11-22 MED ORDER — FLUTICASONE-UMECLIDIN-VILANT 100-62.5-25 MCG/INH IN AEPB: 1.0000 | INHALATION_SPRAY | Freq: Every day | RESPIRATORY_TRACT | 5 refills | Status: DC

## 2018-11-22 NOTE — Telephone Encounter (Signed)
Patient called and said he did not receive his Trelegy yet from the [place]. I looked in his chart and it says transmission failed. He also had an appt with Dr. Delorse Lek on Thursday for a 1 month follow up. He pushed it out a month, but wants to know if he needs to come sooner for the follow up.

## 2018-11-22 NOTE — Telephone Encounter (Signed)
Prescription has been sent back into the pharmacy. Called patient and informed that prescription has been sent in and to call with any other needs or concerns.

## 2018-11-24 ENCOUNTER — Ambulatory Visit: Payer: PRIVATE HEALTH INSURANCE | Admitting: Allergy

## 2018-11-29 ENCOUNTER — Encounter (INDEPENDENT_AMBULATORY_CARE_PROVIDER_SITE_OTHER): Payer: Self-pay | Admitting: *Deleted

## 2018-11-29 ENCOUNTER — Telehealth (INDEPENDENT_AMBULATORY_CARE_PROVIDER_SITE_OTHER): Payer: Self-pay | Admitting: *Deleted

## 2018-11-29 MED ORDER — PEG-KCL-NACL-NASULF-NA ASC-C 140 G PO SOLR: 1.0000 | Freq: Once | ORAL | 0 refills | Status: AC

## 2018-11-29 NOTE — Telephone Encounter (Signed)
Patient needs plenvu 

## 2018-12-22 ENCOUNTER — Ambulatory Visit (INDEPENDENT_AMBULATORY_CARE_PROVIDER_SITE_OTHER): Payer: PRIVATE HEALTH INSURANCE | Admitting: Allergy

## 2018-12-22 ENCOUNTER — Encounter: Payer: Self-pay | Admitting: Allergy

## 2018-12-22 ENCOUNTER — Other Ambulatory Visit: Payer: Self-pay

## 2018-12-22 DIAGNOSIS — H1013 Acute atopic conjunctivitis, bilateral: Secondary | ICD-10-CM | POA: Diagnosis not present

## 2018-12-22 DIAGNOSIS — J3089 Other allergic rhinitis: Secondary | ICD-10-CM

## 2018-12-22 DIAGNOSIS — T781XXD Other adverse food reactions, not elsewhere classified, subsequent encounter: Secondary | ICD-10-CM | POA: Diagnosis not present

## 2018-12-22 DIAGNOSIS — J410 Simple chronic bronchitis: Secondary | ICD-10-CM

## 2018-12-22 DIAGNOSIS — T50905D Adverse effect of unspecified drugs, medicaments and biological substances, subsequent encounter: Secondary | ICD-10-CM

## 2018-12-22 MED ORDER — FAMOTIDINE 20 MG PO TABS: 20.0000 mg | ORAL_TABLET | Freq: Two times a day (BID) | ORAL | 1 refills | Status: DC

## 2018-12-22 MED ORDER — MONTELUKAST SODIUM 10 MG PO TABS: 10.0000 mg | ORAL_TABLET | Freq: Every day | ORAL | 1 refills | Status: AC

## 2018-12-22 MED ORDER — FLUTICASONE-UMECLIDIN-VILANT 100-62.5-25 MCG/INH IN AEPB: 1.0000 | INHALATION_SPRAY | Freq: Every day | RESPIRATORY_TRACT | 1 refills | Status: AC

## 2018-12-22 NOTE — Patient Instructions (Addendum)
Allergies   - continue avoidance measures for dust mites, dog, grass pollen, tree pollen, weed pollen, cockroach, molds  - continue Xyzal 5mg  daily  - continue Singulair 10mg  daily  - continue Flonase 2 sprays each nostril daily as needed for nasal congestion.  Use for 1-2 weeks at a time before stopping once symptoms improve  - continue nasal Atrovent for nasal drainage as needed  - continue Pazeo or Pataday eye drop 1 drop each eye daily as needed   - will plan to start allergen immunotherapy (allergy shots) the summer once the issues surrounding coronavirus has improved hopefully  COPD  - have access to albuterol inhaler 2 puffs every 4-6 hours as needed for cough/wheeze/shortness of breath/chest tightness.  May use 15-20 minutes prior to activity.   Monitor frequency of use.    - continue Trelegy 1 inhalation daily  - will repeat spirometry (breathing testing) to determine if we can improve your lung function with inhaler medications as above at next in-office visit.  Pollen food allergy syndrome  - The oral allergy syndrome (OAS) or pollen-food allergy syndrome (PFAS) is a relatively common form of food allergy, particularly in adults. It typically occurs in people who have pollen allergies when the immune system "sees" proteins on the food that look like proteins on the pollen. This results in the allergy antibody (IgE) binding to the food instead of the pollen. Patients typically report itching and/or mild swelling of the mouth and throat immediately following ingestion of certain uncooked fruits (including nuts) or raw vegetables. Only a very small number of affected individuals experience systemic allergic reactions, such as anaphylaxis which occurs with true food allergies.    This is likely what's happening with tomatoes.    Drug allergy   - agree with penicillin testing and challenge with Dr. Blanchard Mane    Follow-up in 3 months (July 2020)

## 2018-12-22 NOTE — Progress Notes (Signed)
Start: 1050 Location home End

## 2018-12-22 NOTE — Progress Notes (Signed)
RE: Juan Baldwin MRN: [id number] DOB: 03-14-1956 Date of Telemedicine Visit: 12/22/2018  Referring provider: Floyde Parkinsyer, Christopher, MD Primary care provider: Floyde Parkinsyer, Christopher, MD  Chief Complaint: Allergic Rhinitis    Telemedicine Follow Up Visit via Telephone: I connected with Wyvonne LenzMichael Nellums for a follow up on 12/22/18 by telephone and verified that I am speaking with the correct person using two identifiers.   I discussed the limitations, risks, security and privacy concerns of performing an evaluation and management service by telephone and the availability of in person appointments. I also discussed with the patient that there may be a patient responsible charge related to this service. The patient expressed understanding and agreed to proceed.  Patient is at home.  Provider is at the office.  Visit start time: 1050 Visit end time: 1125 Insurance consent/check in by: Marlene BastMarie C Medical consent and medical assistant/nurse: Irwin BrakemanJavier G  History of Present Illness: He is a 63 y.o. male, who is being followed for allergic rhinitis with conjunctivitis, PFAS, COPD and drug allergy. His previous allergy office visit was on 10/19/18 with Dr. Delorse LekPadgett.  I recommended that he change his Zyrtec to Xyzal and continue with his Singulair at the last visit he states that this change to Xyzal has been very helpful and he has not had any further episodes of nasal drainage during the day.  He also reports he is using Flonase in the morning and the Atrovent nasal spray at night and that has also been very helpful for him.  He also states he is having less sneezing fits when he goes to sit out on his porch.  He still wants to start allergy shots but he is hesitant to start right now in the midst of coronavirus and needing to come into the office on a weekly basis.  He states he has been staying home for the most part and is very nervous about going out. He does have a history of sleep apnea and states that he is  also started using distilled water in his CPAP device for humidification and he also feels like that has helped with his nasal symptoms as well. With his COPD he feels to be about the same he is doing Trelegy now.  He has not had any worsening of his breathing but states that he still gets winded when he has to do long activities like walking down to his mailbox or get his mail. He continues to avoid tomato based products to avoid having the oral symptoms. After last visit he was being set up to have penicillin [facility].   Assessment and Plan: Casimiro NeedleMichael is a 63 y.o. male with:  Allergies   - continue avoidance measures for dust mites, dog, grass pollen, tree pollen, weed pollen, cockroach, molds  - continue Xyzal 5mg  daily  - continue Singulair 10mg  daily  - continue Flonase 2 sprays each nostril daily as needed for nasal congestion.  Use for 1-2 weeks at a time before stopping once symptoms improve  - continue nasal Atrovent for nasal drainage as needed  - continue Pazeo or Pataday eye drop 1 drop each eye daily as needed   - will plan to start allergen immunotherapy (allergy shots) the summer once the issues surrounding coronavirus has improved hopefully  COPD  - have access to albuterol inhaler 2 puffs every 4-6 hours as needed for cough/wheeze/shortness of breath/chest tightness.  May use 15-20 minutes prior to activity.   Monitor frequency of use.    -  continue Trelegy 1 inhalation daily  - will repeat spirometry (breathing testing) to determine if we can improve your lung function with inhaler medications as above at next in-office visit.  Pollen food allergy syndrome  - The oral allergy syndrome (OAS) or pollen-food allergy syndrome (PFAS) is a relatively common form of food allergy, particularly in adults. It typically occurs in people who have pollen allergies when the immune system "sees" proteins on the food that look like proteins on the pollen. This results in  the allergy antibody (IgE) binding to the food instead of the pollen. Patients typically report itching and/or mild swelling of the mouth and throat immediately following ingestion of certain uncooked fruits (including nuts) or raw vegetables. Only a very small number of affected individuals experience systemic allergic reactions, such as anaphylaxis which occurs with true food allergies.    This is likely what's happening with tomatoes.    Drug allergy   - agree with penicillin testing and challenge with Dr. Blanchard Mane  Follow-up in 3 months (July 2020)  Diagnostics: None.  Medication List:  Current Outpatient Medications  Medication Sig Dispense Refill  . albuterol (PROVENTIL HFA;VENTOLIN HFA) 108 (90 Base) MCG/ACT inhaler Inhale 2 puffs into the lungs every 4 (four) hours as needed for wheezing or shortness of breath. 1 Inhaler 2  . ALPRAZolam (XANAX) 0.5 MG tablet Take 0.5 mg by mouth 2 (two) times daily.    . baclofen (LIORESAL) 10 MG tablet Take 10 mg by mouth 3 (three) times daily.    . carbamazepine (TEGRETOL) 200 MG tablet Take 200 mg by mouth 4 (four) times daily.    . cetirizine (ZYRTEC) 10 MG tablet Take 10 mg by mouth daily.    . cholecalciferol (VITAMIN D) 25 MCG (1000 UT) tablet Take 1,000 Units by mouth daily.    . clotrimazole (LOTRIMIN) 1 % cream Apply 1 application topically 2 (two) times daily.    . famotidine (PEPCID) 20 MG tablet Take 20 mg by mouth 2 (two) times daily.    . fluticasone (FLONASE) 50 MCG/ACT nasal spray Place 2 sprays into both nostrils daily.    . Fluticasone-Umeclidin-Vilant (TRELEGY ELLIPTA) 100-62.5-25 MCG/INH AEPB Inhale 1 puff into the lungs daily. 60 each 5  . guaiFENesin (ROBITUSSIN) 100 MG/5ML liquid Take 200 mg by mouth 2 times daily at 12 noon and 4 pm.    . hydrochlorothiazide (HYDRODIURIL) 25 MG tablet Take 25 mg by mouth daily.    Marland Kitchen ipratropium (ATROVENT) 0.03 % nasal spray Place 2 sprays into both nostrils every 12 (twelve) hours.    Marland Kitchen  ketotifen (ZADITOR) 0.025 % ophthalmic solution 1 drop 2 (two) times daily.    Marland Kitchen levocetirizine (XYZAL) 5 MG tablet Take 1 tablet (5 mg total) by mouth every evening. 90 tablet 1  . lisinopril (PRINIVIL,ZESTRIL) 40 MG tablet Take 40 mg by mouth daily.    . montelukast (SINGULAIR) 10 MG tablet Take 10 mg by mouth at bedtime.    . Multiple Vitamin (MULTIVITAMIN) tablet Take 1 tablet by mouth daily.    . mupirocin ointment (BACTROBAN) 2 % Place 1 application into the nose 2 (two) times daily.    . Olopatadine HCl (PAZEO) 0.7 % SOLN Apply 1 drop to eye daily. 1 Bottle 3  . sildenafil (VIAGRA) 100 MG tablet Take 100 mg by mouth daily as needed for erectile dysfunction.    . silver sulfADIAZINE (SILVADENE) 1 % cream Apply 1 application topically daily.    Marland Kitchen terbinafine (LAMISIL) 250 MG tablet  Take 250 mg by mouth daily.    . Tiotropium Bromide Monohydrate 1.25 MCG/ACT AERS Inhale 2 puffs into the lungs daily.     No current facility-administered medications for this visit.    Allergies: Allergies  Allergen Reactions  . Amlodipine   . Chlorzoxazone   . Cyclobenzaprine   . Gabapentin   . Penicillins Hives   I reviewed his past medical history, social history, family history, and environmental history and no significant changes have been reported from previous visit on 10/19/18.  Review of Systems  Constitutional: Negative for chills and fever.  HENT: Positive for sneezing. Negative for congestion, postnasal drip, rhinorrhea and sore throat.   Eyes: Negative for pain, discharge and itching.  Respiratory: Positive for shortness of breath. Negative for cough, chest tightness and wheezing.   Cardiovascular: Negative for chest pain.  Gastrointestinal: Negative for abdominal pain, constipation, diarrhea, nausea and vomiting.  Musculoskeletal: Negative for myalgias.  Skin: Negative for rash.  Neurological: Negative for headaches.   Objective: Physical Exam Not obtained as encounter was done via  telephone.   Previous notes and tests were reviewed.  I discussed the assessment and treatment plan with the patient. The patient was provided an opportunity to ask questions and all were answered. The patient agreed with the plan and demonstrated an understanding of the instructions.   The patient was advised to call back or seek an in-person evaluation if the symptoms worsen or if the condition fails to improve as anticipated.  I provided 35 minutes of non-face-to-face time during this encounter.  It was my pleasure to participate in Brennon Correia's care today. Please feel free to contact me with any questions or concerns.   Sincerely,  Zahari Fazzino Larose Hires, MD

## 2018-12-23 ENCOUNTER — Encounter: Payer: Self-pay | Admitting: Allergy

## 2019-01-19 DIAGNOSIS — Z86010 Personal history of colonic polyps: Principal | ICD-10-CM

## 2019-03-16 ENCOUNTER — Ambulatory Visit: Payer: PRIVATE HEALTH INSURANCE | Admitting: Allergy

## 2019-03-23 ENCOUNTER — Ambulatory Visit: Payer: PRIVATE HEALTH INSURANCE | Admitting: Allergy

## 2019-04-10 ENCOUNTER — Encounter (INDEPENDENT_AMBULATORY_CARE_PROVIDER_SITE_OTHER): Payer: Self-pay | Admitting: *Deleted

## 2019-04-17 ENCOUNTER — Telehealth (INDEPENDENT_AMBULATORY_CARE_PROVIDER_SITE_OTHER): Payer: Self-pay | Admitting: *Deleted

## 2019-04-17 NOTE — Telephone Encounter (Signed)
Referring MD/PCP: [facility]   Procedure: tcs  Reason/Indication:  Hx polyps  Has patient had this procedure before?  Yes, 2010  If so, when, by whom and where?    Is there a family history of colon cancer?  no  Who?  What age when diagnosed?    Is patient diabetic?   no      Does patient have prosthetic heart valve or mechanical valve?  no  Do you have a pacemaker/defibrillator?  no  Has patient ever had endocarditis/atrial fibrillation? no  Does patient use oxygen? no  Has patient had joint replacement within last 12 months?  no  Is patient constipated or do they take laxatives? no  Does patient have a history of alcohol/drug use?  no  Is patient on blood thinner such as Coumadin, Plavix and/or Aspirin? no  Medications: diclofenac, ibuprofen 800 mg daily, prednisone 20 mg daily, mupirocin 2%, baclofen, cetirizine 10 mg, famotidine 20 mg, mvi, clotrimazole, sildenafil 100 mg, hctz 25 mg, lisinopril 40 mg, vit d, terbinafine 250 mg, fluticasone 20 mg, ketotifen .025 mg, montelukast 10 mg, alprazolam 0.5 mg bid, carbamazepine 200 mg bid prn  09/26/18 NO NEED FOR PROPOFOL PER DR REHMAN  Allergies: pcn  Medication Adjustment per Dr Charlena Cross, NP:   Procedure date & time: 05/18/19 at 830

## 2019-04-18 ENCOUNTER — Other Ambulatory Visit: Payer: Self-pay

## 2019-04-18 ENCOUNTER — Ambulatory Visit (INDEPENDENT_AMBULATORY_CARE_PROVIDER_SITE_OTHER): Payer: Self-pay

## 2019-04-25 NOTE — Telephone Encounter (Signed)
Already addressed

## 2019-05-16 ENCOUNTER — Other Ambulatory Visit (HOSPITAL_COMMUNITY)
Admission: RE | Admit: 2019-05-16 | Discharge: 2019-05-16 | Disposition: A | Discharge status: Home / Self Care | Payer: Non-veteran care | Source: Ambulatory Visit | Attending: Internal Medicine | Admitting: Internal Medicine

## 2019-05-18 ENCOUNTER — Encounter (HOSPITAL_COMMUNITY): Payer: Self-pay

## 2019-05-18 ENCOUNTER — Ambulatory Visit (HOSPITAL_COMMUNITY): Admit: 2019-05-18 | Payer: Non-veteran care | Admitting: Internal Medicine

## 2019-05-18 SURGERY — COLONOSCOPY
Anesthesia: Moderate Sedation

## 2019-07-21 ENCOUNTER — Encounter (INDEPENDENT_AMBULATORY_CARE_PROVIDER_SITE_OTHER): Payer: Self-pay | Admitting: *Deleted

## 2019-08-02 ENCOUNTER — Telehealth: Payer: Self-pay

## 2019-08-02 NOTE — Telephone Encounter (Signed)
I left a voicemail to see if the patient would like to schedule a follow up. If so I will need to request a new [facility] auth.  Will follow back up .

## 2019-10-04 NOTE — Telephone Encounter (Signed)
Patient has not returned call or scheduled an ov.  I am going to close this authorization.
